# Patient Record
Sex: Female | Born: 1965 | Race: White | Hispanic: No | Marital: Married | Smoking: Never smoker
Health system: Southern US, Community
[De-identification: ages and names within clinical notes are randomized; demographics above are authoritative.]

## PROBLEM LIST (undated history)

## (undated) DIAGNOSIS — E669 Obesity, unspecified: Secondary | ICD-10-CM

## (undated) DIAGNOSIS — G43909 Migraine, unspecified, not intractable, without status migrainosus: Secondary | ICD-10-CM

## (undated) DIAGNOSIS — R32 Unspecified urinary incontinence: Secondary | ICD-10-CM

## (undated) DIAGNOSIS — M1990 Unspecified osteoarthritis, unspecified site: Secondary | ICD-10-CM

## (undated) DIAGNOSIS — K5792 Diverticulitis of intestine, part unspecified, without perforation or abscess without bleeding: Secondary | ICD-10-CM

## (undated) HISTORY — DX: Unspecified osteoarthritis, unspecified site: M19.90

## (undated) HISTORY — DX: Diverticulitis of intestine, part unspecified, without perforation or abscess without bleeding: K57.92

## (undated) HISTORY — DX: Obesity, unspecified: E66.9

## (undated) HISTORY — DX: Unspecified urinary incontinence: R32

## (undated) HISTORY — DX: Migraine, unspecified, not intractable, without status migrainosus: G43.909

---

## 1993-12-07 HISTORY — PX: LAPAROSCOPIC SALPINGO OOPHERECTOMY: SHX5927

## 2006-12-07 HISTORY — PX: LAPAROSCOPIC SALPINGO OOPHERECTOMY: SHX5927

## 2006-12-07 HISTORY — PX: ABDOMINAL HYSTERECTOMY: SHX81

## 2008-12-07 HISTORY — PX: OTHER SURGICAL HISTORY: SHX169

## 2009-01-28 ENCOUNTER — Ambulatory Visit: Payer: Self-pay | Admitting: Internal Medicine

## 2009-09-27 ENCOUNTER — Ambulatory Visit: Payer: Self-pay | Admitting: Unknown Physician Specialty

## 2009-10-11 ENCOUNTER — Ambulatory Visit: Payer: Self-pay | Admitting: Unknown Physician Specialty

## 2009-10-18 ENCOUNTER — Ambulatory Visit: Payer: Self-pay | Admitting: Unknown Physician Specialty

## 2010-04-21 ENCOUNTER — Ambulatory Visit: Payer: Self-pay | Admitting: Obstetrics and Gynecology

## 2012-08-30 ENCOUNTER — Ambulatory Visit: Payer: Self-pay | Admitting: Internal Medicine

## 2013-05-20 IMAGING — MG MM DIGITAL SCREENING BILAT W/ CAD
1 series · 4 of 4 positions shown · non-contrast
Comparison: none

REASON FOR EXAM: SCR MAMMO NO ORDER
COMMENTS:

PROCEDURE:     MMM - MMM DGT SCR NO ORDER W/CAD  - August 30, 2012  [DATE]
RESULT:     COMPARISON:  04/21/2010
TECHNIQUE: Digital screening mammograms were obtained. FDA approved
computer-aided detection (CAD) for mammography was utilized for this study.

[Series 8859: R CC · right · 4 of 4 slices shown]
[im 1/4]
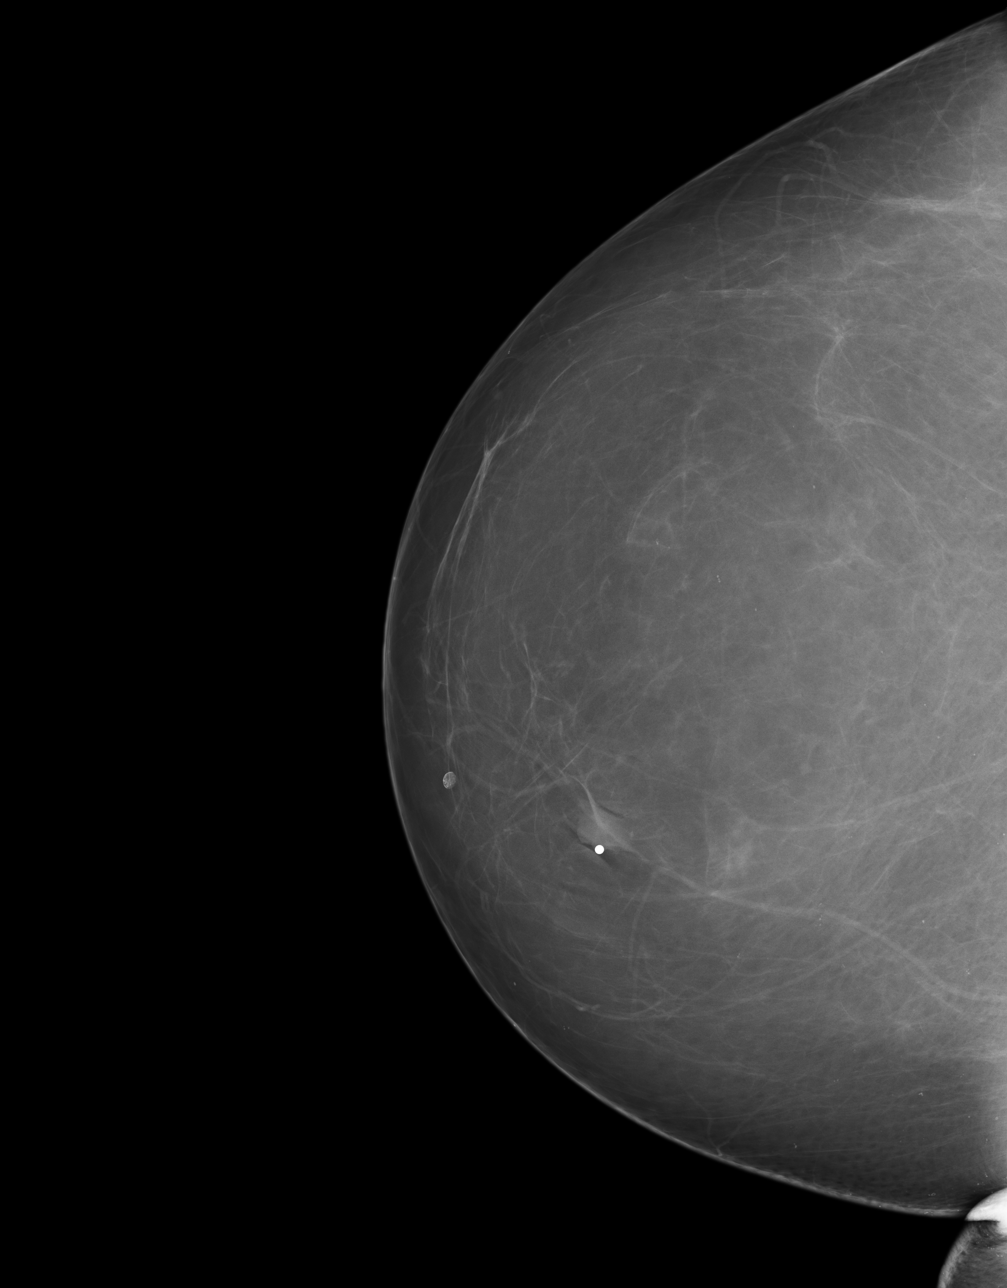
[im 2/4]
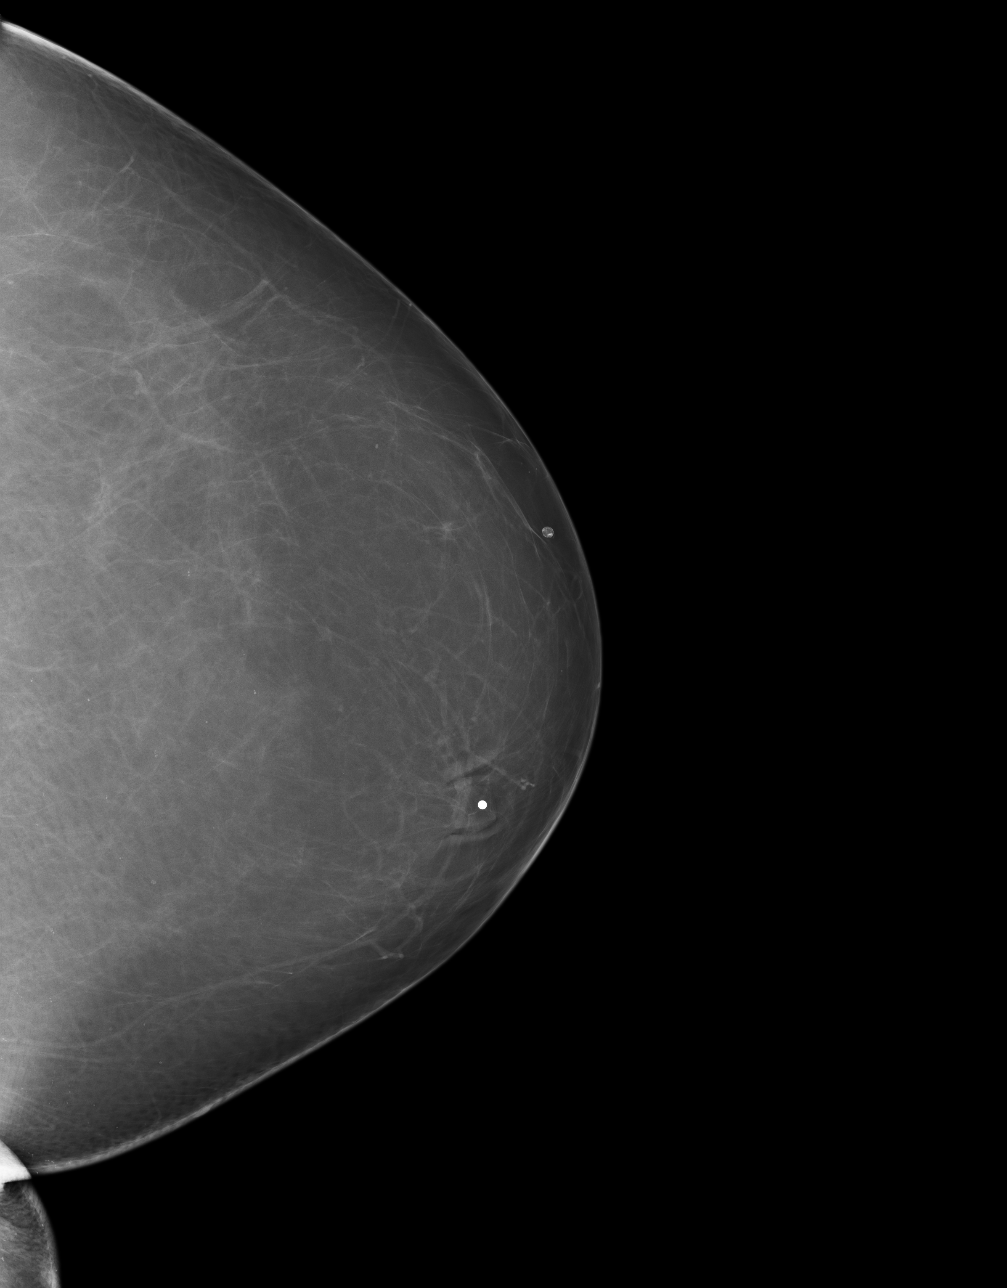
[im 3/4]
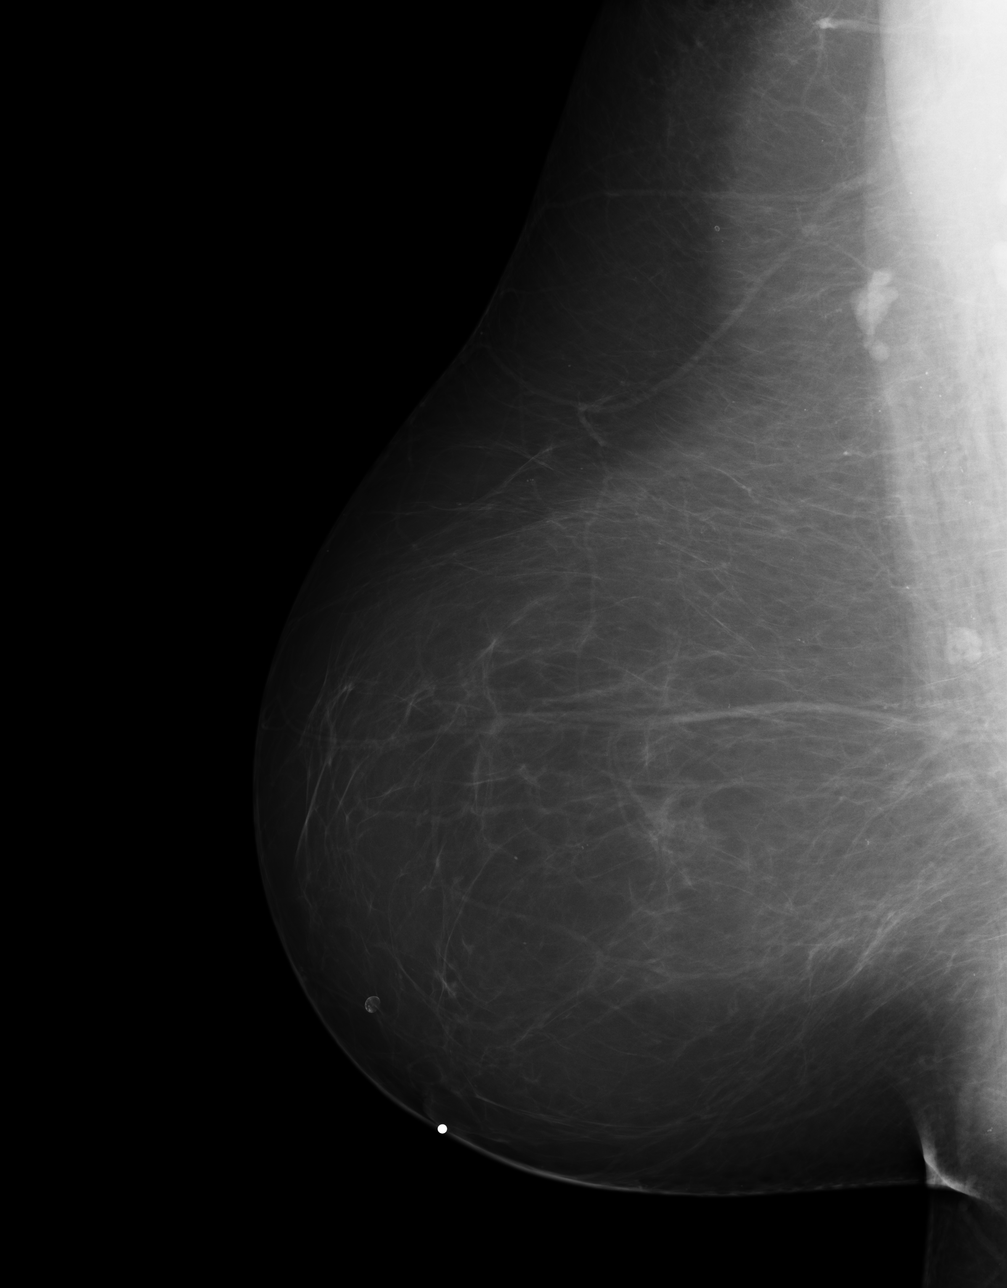
[im 4/4]
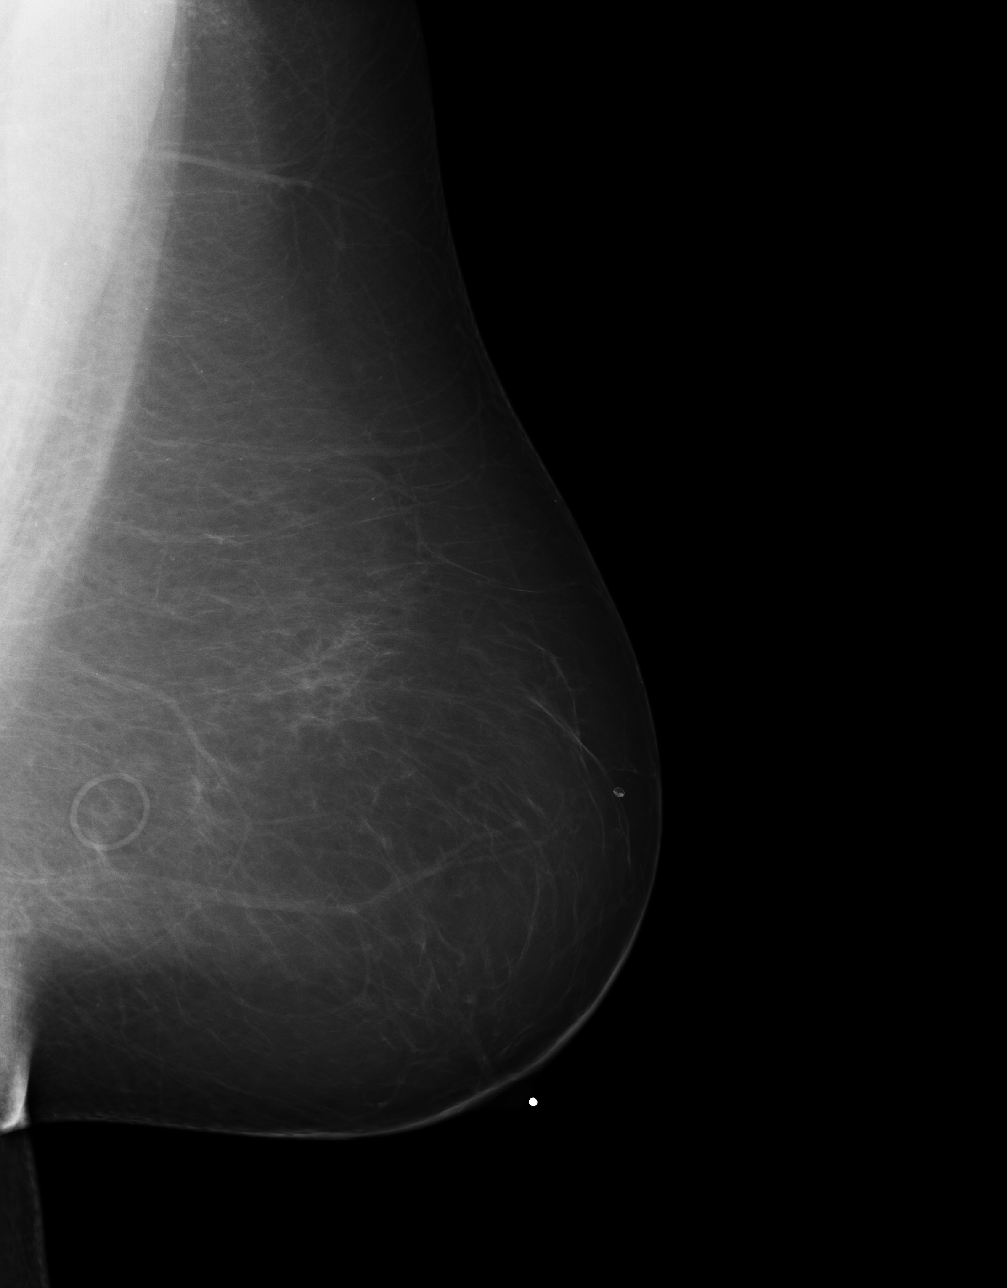

[4 of 4 positions shown; findings below may reference images not displayed]

FINDING: Bilateral breasts demonstrate a scattered fibroglandular density. There is
no dominant mass, architectural distortion or clusters of suspicious
microcalcifications.
IMPRESSION: 1.     Stable bilateral mammogram.
2.     Annual mammographic follow up recommended.
3.     BI-RADS:  Category 2- Benign.

A negative mammogram report does not preclude biopsy or other evaluation of
a clinically palpable or otherwise suspicious mass or lesion. Breast cancer
may not be detected by mammography in up to 10% of cases.

[REDACTED]

## 2013-07-06 ENCOUNTER — Encounter: Payer: Self-pay | Admitting: Adult Health

## 2013-07-06 ENCOUNTER — Ambulatory Visit (INDEPENDENT_AMBULATORY_CARE_PROVIDER_SITE_OTHER): Payer: BC Managed Care – PPO | Admitting: Adult Health

## 2013-07-06 VITALS — BP 138/76 | HR 98 | Temp 98.8°F | Resp 12 | Ht 68.0 in | Wt 245.5 lb

## 2013-07-06 DIAGNOSIS — B372 Candidiasis of skin and nail: Secondary | ICD-10-CM

## 2013-07-06 DIAGNOSIS — Z0000 Encounter for general adult medical examination without abnormal findings: Secondary | ICD-10-CM

## 2013-07-06 DIAGNOSIS — Z713 Dietary counseling and surveillance: Secondary | ICD-10-CM

## 2013-07-06 MED ORDER — PHENTERMINE HCL 37.5 MG PO CAPS: 37.5000 mg | ORAL_CAPSULE | ORAL | Status: DC

## 2013-07-06 MED ORDER — NYSTATIN 100000 UNIT/GM EX CREA: TOPICAL_CREAM | Freq: Two times a day (BID) | CUTANEOUS | Status: AC

## 2013-07-06 NOTE — Assessment & Plan Note (Addendum)
Patient develops yeast in skin folds on lower abdomen. Apply nystatin cream bid to affected area. It is hard for her to keep this area dry. She sweats and area stays moist creating grounds for the growth of yeast.

## 2013-07-06 NOTE — Progress Notes (Signed)
Subjective:    Patient ID: Dana Howell, female    DOB: 21-May-1966, 47 y.o.   MRN: [id number]  HPI  Dana Howell is a pleasant 47 y/o female who presents to clinic to establish care. She was previously followed by Dr. Audelia Acton at [facility] in [place]. She is followed by Bosie Clos, NP at [facility]) for anxiety and depression. Jahnaya is overall feeling well. She is concerned about her weight and would like to work on this. She is limited in her exercising because of problems with her knees. She is planning on starting to ride an exercise bike.   Past Medical History  Diagnosis Date  . Arthritis   . Migraine   . Diverticulitis   . Urinary incontinence     Urgency    Past Surgical History  Procedure Laterality Date  . Abdominal hysterectomy  2008    [facility]  . Laparoscopic salpingo oopherectomy  1995    right  . Laparoscopic salpingo oopherectomy  2008    left  . Right knee surgery  2010    right - Dr. Gavin Potters    Family History  Problem Relation Age of Onset  . Arthritis Mother   . Hypertension Mother   . Diabetes Mother   . Arthritis Father   . Hyperlipidemia Father   . Hypertension Father   . ADD / ADHD Brother   . Arthritis Maternal Grandmother   . Cancer Maternal Grandfather     lung ca    History   Social History  . Marital Status: Married    Spouse Name: N/A    Number of Children: 1  . Years of Education: 13   Occupational History  . [employer]     [employer]   Social History Main Topics  . Smoking status: Never Smoker   . Smokeless tobacco: Never Used  . Alcohol Use: Yes     Comment: 1 drink every 6-7 months  . Drug Use: No  . Sexually Active: Yes -- Female partner(s)    Birth Control/ Protection: Surgical   Other Topics Concern  . Not on file   Social History Narrative   Dana Howell was born and grew up in [place], [place]. She graduated from [employer]. She lives at home with her husband of 78 years  and their 57 y/o son. 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 Previously she worked at [employer] for 11 years. Dana Howell enjoys working with [employer], cross-stitching, crafting, camping and crocheting.      Review of Systems  Constitutional: Positive for unexpected weight change. Negative for fever, chills and fatigue.  Eyes:       Wears contacts. Followed by Dr. Clydene Pugh in [place]  Respiratory: Positive for wheezing. Negative for cough and shortness of breath.        Faint wheezing noticed at night.  Cardiovascular: Negative.   Gastrointestinal: Negative for nausea, vomiting, abdominal pain, diarrhea, constipation and blood in stool.  Endocrine: Negative.   Genitourinary: Negative.   Musculoskeletal:       Bilateral knee problems. Low back discomfort.  Skin:       Multiple freckles throughout  Allergic/Immunologic: Positive for environmental allergies.       Seasonal allergies  Neurological: Positive for headaches.       Mostly sinus HA. Tylenol or Aleve helps  Hematological: Negative.   Psychiatric/Behavioral: Negative for suicidal ideas, behavioral problems, confusion, self-injury and  agitation. The patient is nervous/anxious.        Hx of depression. Previously on wellbutrin and zoloft. Currently taking natural supplements.     BP 138/76  Pulse 98  Temp(Src) 98.8 F (37.1 C) (Oral)  Resp 12  Ht 5\' 8"  (1.727 m)  Wt 245 lb 8 oz (111.358 kg)  BMI 37.34 kg/m2  SpO2 97%    Objective:   Physical Exam  Constitutional: She is oriented to person, place, and time. No distress.  Overweight, pleasant female in NAD.  HENT:  Head: Normocephalic and atraumatic.  Right Ear: External ear normal.  Left Ear: External ear normal.  Eyes: Conjunctivae and EOM are normal. Pupils are equal, round, and reactive to light.  Neck: Normal range of motion. Neck supple. No tracheal deviation present.  Cardiovascular: Normal rate,  regular rhythm, normal heart sounds and intact distal pulses.  Exam reveals no gallop.   No murmur heard. Pulmonary/Chest: Effort normal and breath sounds normal. No respiratory distress. She has no wheezes. She has no rales.  Abdominal: Soft. Bowel sounds are normal.  Musculoskeletal: Normal range of motion. She exhibits no edema and no tenderness.  Lymphadenopathy:    She has no cervical adenopathy.  Neurological: She is alert and oriented to person, place, and time. She has normal reflexes.  Skin: Skin is warm and dry.  Psychiatric: She has a normal mood and affect. Her behavior is normal. Judgment and thought content normal.      Assessment & Plan:

## 2013-07-06 NOTE — Assessment & Plan Note (Signed)
Normal physical exam except for problems with excess weight. She will begin to work on losing weight. Encourage slow, steady loss. Consistency is key. Labs: cbc w/diff, bmet, hepatic panel, tsh, lipids.

## 2013-07-06 NOTE — Patient Instructions (Addendum)
   Thank you for choosing [facility] for your health care needs.  Please have your labs drawn at [facility] at your earliest convenience. You cannot have anything to eat or drink for 8 hours prior to lab work. You can have water or plain black coffee without cream or sugar.  The results will be available through MyChart for your convenience. Please remember to activate this. The activation code is located at the end of this form.

## 2013-07-06 NOTE — Assessment & Plan Note (Signed)
Provided patient with copy of Dr. Melina Schools diet. Encouraged exercises with bike that would not be hard on her knees. Once she is able to lose some weight she will be able to tolerate other types of exercises. Encouraged her to keep a food diary. This will keep her aware of her consumption. Provided her with prescription for phentermine.

## 2013-07-07 ENCOUNTER — Encounter: Payer: Self-pay | Admitting: Adult Health

## 2013-07-12 ENCOUNTER — Encounter: Payer: Self-pay | Admitting: *Deleted

## 2013-07-21 ENCOUNTER — Encounter: Payer: Self-pay | Admitting: Adult Health

## 2013-07-26 ENCOUNTER — Ambulatory Visit (INDEPENDENT_AMBULATORY_CARE_PROVIDER_SITE_OTHER): Payer: BC Managed Care – PPO | Admitting: Adult Health

## 2013-07-26 VITALS — BP 143/85 | HR 100 | Temp 98.4°F | Resp 16

## 2013-07-26 DIAGNOSIS — J329 Chronic sinusitis, unspecified: Secondary | ICD-10-CM | POA: Insufficient documentation

## 2013-07-26 MED ORDER — AZITHROMYCIN 250 MG PO TABS: ORAL_TABLET | ORAL | Status: DC

## 2013-07-26 MED ORDER — BENZONATATE 100 MG PO CAPS: 100.0000 mg | ORAL_CAPSULE | Freq: Three times a day (TID) | ORAL | Status: DC | PRN

## 2013-07-26 NOTE — Assessment & Plan Note (Signed)
Start azithromycin. Patient has taken Augmentin in the past and believes that it gave her diarrhea. Tessalon for cough. RTC if symptoms are not improved in 3-4 days.

## 2013-07-26 NOTE — Progress Notes (Signed)
  Subjective:    Patient ID: Dana Howell, female    DOB: 13-May-1966, 47 y.o.   MRN: [id number]  HPI  Patient presents with sinus congestion, cough, chills, low grade temp. Symptoms began on Sunday. Does not recall sick contacts.   Current Outpatient Prescriptions on File Prior to Visit  Medication Sig Dispense Refill  . clonazePAM (KLONOPIN) 1 MG tablet Take 2 mg by mouth at bedtime.       . NON FORMULARY Take 1 each by mouth daily. Plexus Slim Pink Drink      . NON FORMULARY Take 1 each by mouth daily. Plexus Slim X Factor      . NON FORMULARY Take 1 each by mouth daily. Plexus Slim BioCleanse      . NON FORMULARY Take 1 each by mouth daily. Plexus Slim Boost      . nystatin cream (MYCOSTATIN) Apply topically 2 (two) times daily. Apply to affected area twice daily  30 g  0  . phentermine 37.5 MG capsule Take 1 capsule (37.5 mg total) by mouth every morning.  30 capsule  0   No current facility-administered medications on file prior to visit.    Review of Systems  Constitutional: Positive for fever and chills.  HENT: Positive for congestion, postnasal drip and sinus pressure. Negative for sore throat.   Respiratory: Positive for cough. Negative for shortness of breath and wheezing.   Cardiovascular: Negative.   Gastrointestinal: Negative.   Neurological: Negative.   Psychiatric/Behavioral: Negative.        Objective:   Physical Exam  Constitutional: She is oriented to person, place, and time.  Overweight, pleasant female who appears to not be feeling well  HENT:  Head: Normocephalic and atraumatic.  Oral pharyngeal erythema and drainage.  Cardiovascular: Exam reveals no gallop.   No murmur heard. Tachycardia, regular rhythm  Pulmonary/Chest: Effort normal and breath sounds normal. No respiratory distress. She has no wheezes. She has no rales.  Lymphadenopathy:    She has no cervical adenopathy.  Neurological: She is alert and oriented to person, place, and time.  Skin:  Skin is warm and dry.  Psychiatric: She has a normal mood and affect. Her behavior is normal. Judgment and thought content normal.          Assessment & Plan:

## 2013-08-10 ENCOUNTER — Other Ambulatory Visit: Payer: Self-pay | Admitting: Adult Health

## 2013-08-10 NOTE — Telephone Encounter (Signed)
Ok to refill? Last OV 8.20.14 Last filled 7.31.14

## 2013-08-10 NOTE — Telephone Encounter (Signed)
Refilled Rx. 

## 2013-08-10 NOTE — Telephone Encounter (Signed)
Ok to refill 

## 2013-08-10 NOTE — Telephone Encounter (Signed)
Rx faxed to [facility] 

## 2013-09-01 ENCOUNTER — Ambulatory Visit: Payer: Self-pay | Admitting: Nurse Practitioner

## 2013-09-07 ENCOUNTER — Other Ambulatory Visit: Payer: Self-pay | Admitting: Adult Health

## 2013-09-07 NOTE — Telephone Encounter (Signed)
yes

## 2013-09-07 NOTE — Telephone Encounter (Signed)
Rx faxed

## 2013-09-07 NOTE — Telephone Encounter (Signed)
Refill

## 2013-09-08 ENCOUNTER — Encounter: Payer: Self-pay | Admitting: *Deleted

## 2013-09-28 ENCOUNTER — Telehealth: Payer: Self-pay | Admitting: Adult Health

## 2013-09-28 NOTE — Telephone Encounter (Signed)
error 

## 2013-10-12 ENCOUNTER — Other Ambulatory Visit: Payer: Self-pay

## 2013-10-27 ENCOUNTER — Encounter: Payer: Self-pay | Admitting: Adult Health

## 2013-10-27 ENCOUNTER — Ambulatory Visit (INDEPENDENT_AMBULATORY_CARE_PROVIDER_SITE_OTHER): Payer: BC Managed Care – PPO | Admitting: Adult Health

## 2013-10-27 VITALS — BP 126/80 | HR 118 | Temp 98.4°F | Resp 16 | Ht 70.0 in | Wt 311.0 lb

## 2013-10-27 DIAGNOSIS — R059 Cough, unspecified: Secondary | ICD-10-CM

## 2013-10-27 DIAGNOSIS — R05 Cough: Secondary | ICD-10-CM | POA: Insufficient documentation

## 2013-10-27 MED ORDER — AZITHROMYCIN 250 MG PO TABS: ORAL_TABLET | ORAL | Status: DC

## 2013-10-27 MED ORDER — GUAIFENESIN-CODEINE 100-10 MG/5ML PO SOLN: 5.0000 mL | Freq: Three times a day (TID) | ORAL | Status: DC | PRN

## 2013-10-27 NOTE — Progress Notes (Signed)
Pre visit review using our clinic review tool, if applicable. No additional management support is needed unless otherwise documented below in the visit note. 

## 2013-10-27 NOTE — Patient Instructions (Signed)
  Start Azithromycin as directed.  Robitussin AC for cough. This has codeine so it will make you sleepy.

## 2013-10-27 NOTE — Progress Notes (Signed)
  Subjective:    Patient ID: Kharisma Glasner, female    DOB: September 21, 1966, 47 y.o.   MRN: [id number]  HPI  Pt is a pleasant 47 yo female, presents with congestion and productive cough with yellow sputum since last Thursday.  Pt has been taking tessalon perles, states it was helping with cough at first, but doesn't seem to be suppressing cough as well now.   Pt denies body aches, fever, or chills.   Current Outpatient Prescriptions on File Prior to Visit  Medication Sig Dispense Refill  . benzonatate (TESSALON) 100 MG capsule Take 1 capsule (100 mg total) by mouth 3 (three) times daily as needed for cough.  20 capsule  0  . clonazePAM (KLONOPIN) 1 MG tablet Take 2 mg by mouth at bedtime.       . NON FORMULARY Take 1 each by mouth daily. Plexus Slim Pink Drink      . NON FORMULARY Take 1 each by mouth daily. Plexus Slim X Factor      . NON FORMULARY Take 1 each by mouth daily. Plexus Slim BioCleanse      . NON FORMULARY Take 1 each by mouth daily. Plexus Slim Boost      . nystatin cream (MYCOSTATIN) Apply topically 2 (two) times daily. Apply to affected area twice daily  30 g  0  . phentermine (ADIPEX-P) 37.5 MG tablet TAKE 1 TABLET EVERY MORNING  30 tablet  0  . phentermine 37.5 MG capsule Take 1 capsule (37.5 mg total) by mouth every morning.  30 capsule  0   No current facility-administered medications on file prior to visit.     Review of Systems  HENT: Positive for congestion, postnasal drip, sinus pressure and voice change.   Respiratory: Positive for cough and wheezing. Negative for shortness of breath.        Objective:   Physical Exam  Constitutional: She is oriented to person, place, and time. No distress.  Pleasant, overweight female  HENT:  Mouth/Throat: Posterior oropharyngeal erythema present.  Cardiovascular: Normal rate and regular rhythm.   Pulmonary/Chest: Effort normal and breath sounds normal. No respiratory distress. She has no wheezes. She has no rales.   Neurological: She is alert and oriented to person, place, and time.  Psychiatric: She has a normal mood and affect. Her behavior is normal. Judgment and thought content normal.    BP 126/80  Pulse 118  Temp(Src) 98.4 F (36.9 C) (Oral)  Resp 16  Ht 5\' 10"  (1.778 m)  Wt 311 lb (141.069 kg)  BMI 44.62 kg/m2  SpO2 95%       Assessment & Plan:

## 2013-10-27 NOTE — Assessment & Plan Note (Signed)
Productive yellow sputum. Start azithromycin, Robitussin-AC for cough. Continue supportive care with Tylenol for general aches and pains and fever. RTC if no improvement in 4-5 days

## 2013-10-30 ENCOUNTER — Other Ambulatory Visit: Payer: Self-pay | Admitting: Adult Health

## 2013-10-30 ENCOUNTER — Encounter: Payer: Self-pay | Admitting: Adult Health

## 2013-10-30 ENCOUNTER — Telehealth: Payer: Self-pay | Admitting: Adult Health

## 2013-10-30 MED ORDER — GUAIFENESIN-CODEINE 100-10 MG/5ML PO SOLN: 5.0000 mL | Freq: Three times a day (TID) | ORAL | Status: DC | PRN

## 2013-10-30 MED ORDER — ALBUTEROL SULFATE HFA 108 (90 BASE) MCG/ACT IN AERS: 2.0000 | INHALATION_SPRAY | Freq: Four times a day (QID) | RESPIRATORY_TRACT | Status: DC | PRN

## 2013-10-30 NOTE — Telephone Encounter (Signed)
Pt states that Raquel says she was going to call in her Albuterol inhaler and she wanted to [facility] [place] [facility] [address]

## 2013-10-31 ENCOUNTER — Encounter: Payer: Self-pay | Admitting: *Deleted

## 2013-11-14 ENCOUNTER — Encounter: Payer: Self-pay | Admitting: Adult Health

## 2013-12-17 ENCOUNTER — Encounter: Payer: Self-pay | Admitting: Adult Health

## 2013-12-18 ENCOUNTER — Ambulatory Visit (INDEPENDENT_AMBULATORY_CARE_PROVIDER_SITE_OTHER): Payer: BC Managed Care – PPO | Admitting: Adult Health

## 2013-12-18 ENCOUNTER — Encounter: Payer: Self-pay | Admitting: Adult Health

## 2013-12-18 VITALS — BP 122/82 | HR 92 | Temp 98.1°F | Resp 12 | Wt 322.5 lb

## 2013-12-18 DIAGNOSIS — J329 Chronic sinusitis, unspecified: Secondary | ICD-10-CM

## 2013-12-18 MED ORDER — AMOXICILLIN-POT CLAVULANATE 875-125 MG PO TABS: 1.0000 | ORAL_TABLET | Freq: Two times a day (BID) | ORAL | Status: DC

## 2013-12-18 MED ORDER — FLUTICASONE PROPIONATE 50 MCG/ACT NA SUSP: 2.0000 | Freq: Every day | NASAL | Status: DC

## 2013-12-18 NOTE — Assessment & Plan Note (Signed)
Symptoms ongoing greater than 14 days. Start Augmentin x 10 days. Add flonase daily. RTC if no improvement within 4-5 days.

## 2013-12-18 NOTE — Progress Notes (Signed)
   Subjective:    Patient ID: Dana Howell, female    DOB: 06/16/1966, 48 y.o.   MRN: [id number]  HPI  Patient presents with sinus infection, sinus drainage, pressure, HA, fever, post nasal drip, green secretions that have been ongoing for greater than 14 days. She takes Azelastin nasal spray. Has tried decongestants over the counter but not improved.  Current Outpatient Prescriptions on File Prior to Visit  Medication Sig Dispense Refill  . albuterol (PROVENTIL HFA;VENTOLIN HFA) 108 (90 BASE) MCG/ACT inhaler Inhale 2 puffs into the lungs every 6 (six) hours as needed for wheezing or shortness of breath.  1 Inhaler  2  . clonazePAM (KLONOPIN) 1 MG tablet Take by mouth at bedtime. 1-1.5 tab      . guaiFENesin-codeine 100-10 MG/5ML syrup Take 5 mLs by mouth 3 (three) times daily as needed for cough.  120 mL  0  . NON FORMULARY Take 1 each by mouth daily. Plexus Slim Pink Drink      . nystatin cream (MYCOSTATIN) Apply topically 2 (two) times daily. Apply to affected area twice daily  30 g  0   No current facility-administered medications on file prior to visit.     Review of Systems Positive for sinus pressure, drainage that is green, post nasal drip, sinus congestion, fever (none today) Negative for chest symptoms, no wheezing, sob.    Objective:   Physical Exam  Constitutional:  Acutely ill  HENT:  Head: Normocephalic and atraumatic.  Mouth/Throat: No oropharyngeal exudate.  Pharyngeal secretions. Unable to breathe out of her nose 2/2 severe congestion  Cardiovascular: Normal rate and regular rhythm.   Pulmonary/Chest: Effort normal. No respiratory distress.  Lymphadenopathy:    She has no cervical adenopathy.  Neurological: She is alert.  Psychiatric: She has a normal mood and affect. Her behavior is normal. Judgment and thought content normal.          Assessment & Plan:

## 2013-12-18 NOTE — Progress Notes (Signed)
Pre visit review using our clinic review tool, if applicable. No additional management support is needed unless otherwise documented below in the visit note. 

## 2013-12-18 NOTE — Telephone Encounter (Signed)
Disregard my last sentence, I oops-ed! I see it was sent through your account. Thanks!

## 2013-12-24 ENCOUNTER — Encounter: Payer: Self-pay | Admitting: Adult Health

## 2013-12-25 ENCOUNTER — Other Ambulatory Visit: Payer: Self-pay | Admitting: Adult Health

## 2013-12-25 MED ORDER — AZITHROMYCIN 250 MG PO TABS: ORAL_TABLET | ORAL | Status: DC

## 2014-02-22 ENCOUNTER — Telehealth: Payer: Self-pay | Admitting: *Deleted

## 2014-02-22 ENCOUNTER — Ambulatory Visit (INDEPENDENT_AMBULATORY_CARE_PROVIDER_SITE_OTHER): Payer: BC Managed Care – PPO | Admitting: Adult Health

## 2014-02-22 ENCOUNTER — Encounter: Payer: Self-pay | Admitting: Adult Health

## 2014-02-22 VITALS — BP 123/83 | HR 89 | Temp 98.2°F | Resp 14 | Wt 324.0 lb

## 2014-02-22 DIAGNOSIS — R600 Localized edema: Secondary | ICD-10-CM

## 2014-02-22 DIAGNOSIS — Z713 Dietary counseling and surveillance: Secondary | ICD-10-CM

## 2014-02-22 DIAGNOSIS — R609 Edema, unspecified: Secondary | ICD-10-CM

## 2014-02-22 MED ORDER — HYDROCHLOROTHIAZIDE 25 MG PO TABS: ORAL_TABLET | ORAL | Status: AC

## 2014-02-22 MED ORDER — LORCASERIN HCL 10 MG PO TABS: 1.0000 | ORAL_TABLET | Freq: Two times a day (BID) | ORAL | Status: AC

## 2014-02-22 NOTE — Telephone Encounter (Signed)
PA received, given to Raquel for completion.

## 2014-02-22 NOTE — Telephone Encounter (Signed)
PA received from pharmacy

## 2014-02-22 NOTE — Progress Notes (Signed)
Pre visit review using our clinic review tool, if applicable. No additional management support is needed unless otherwise documented below in the visit note. 

## 2014-02-22 NOTE — Telephone Encounter (Signed)
Called [facility] to receive PA form.

## 2014-02-22 NOTE — Patient Instructions (Signed)

## 2014-02-22 NOTE — Progress Notes (Signed)
Patient ID: Dana Howell, female   DOB: Jul 20, 1966, 48 y.o.   MRN: [id number]   Subjective:    Patient ID: Sherrilyn Rist, female    DOB: 02-04-1966, 48 y.o.   MRN: [id number]  HPI  Pt is a pleasant 48 year old female who presents to clinic  To discuss options for weight loss. She has been on phentermine in the past. Did not feel that the medication was effective . She reports that the medication did not suppress her appetite. She has gained 2 pounds since January. Blood pressure is well controlled.    Past Medical History  Diagnosis Date  . Arthritis   . Migraine   . Diverticulitis   . Urinary incontinence     Urgency  . Obesity      Past Surgical History  Procedure Laterality Date  . Abdominal hysterectomy  2008    [facility]  . Laparoscopic salpingo oopherectomy  1995    right  . Laparoscopic salpingo oopherectomy  2008    left  . Right knee surgery  2010    right - Dr. Gavin Potters     Family History  Problem Relation Age of Onset  . Arthritis Mother   . Hypertension Mother   . Diabetes Mother   . Arthritis Father   . Hyperlipidemia Father   . Hypertension Father   . ADD / ADHD Brother   . Arthritis Maternal Grandmother   . Cancer Maternal Grandfather     lung ca     History   Social History  . Marital Status: Married    Spouse Name: N/A    Number of Children: 1  . Years of Education: 13   Occupational History  . [employer]     [employer]   Social History Main Topics  . Smoking status: Never Smoker   . Smokeless tobacco: Never Used  . Alcohol Use: Yes     Comment: 1 drink every 6-7 months  . Drug Use: No  . Sexual Activity: Yes    Partners: Male    Birth Control/ Protection: Surgical   Other Topics Concern  . Not on file   Social History Narrative   Skii was born and grew up in [place], [place]. She graduated from [employer]. She lives at home with her husband of 84 years and their 32 y/o son. 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 Lorilyn enjoys working with [employer], cross-stitching, crafting, camping and crocheting.      Current Outpatient Prescriptions on File Prior to Visit  Medication Sig Dispense Refill  . clonazePAM (KLONOPIN) 1 MG tablet Take by mouth at bedtime. 1-1.5 tab      . fluticasone (FLONASE) 50 MCG/ACT nasal spray Place 2 sprays into both nostrils daily.  16 g  6  . nystatin cream (MYCOSTATIN) Apply topically 2 (two) times daily. Apply to affected area twice daily  30 g  0   No current facility-administered medications on file prior to visit.     Review of Systems  Constitutional: Negative.   HENT: Negative.   Eyes: Negative.   Respiratory: Negative.   Cardiovascular: Positive for leg swelling (noticed at the end of the day).  Gastrointestinal: Negative.   Endocrine: Negative.   Genitourinary: Negative.   Musculoskeletal: Positive for arthralgias (knee pain bilaterally 2/2 weight).  Skin: Negative.   Allergic/Immunologic: Negative.   Neurological: Negative.  Hematological: Negative.   Psychiatric/Behavioral: Negative.        Objective:  BP 123/83  Pulse 89  Temp(Src) 98.2 F (36.8 C) (Oral)  Resp 14  Wt 324 lb (146.965 kg)  SpO2 96%   Physical Exam  Constitutional: She is oriented to person, place, and time.  Pleasant female in NAD  HENT:  Head: Normocephalic and atraumatic.  Eyes: Conjunctivae and EOM are normal.  Cardiovascular: Normal rate and regular rhythm.   Pulmonary/Chest: Effort normal. No respiratory distress.  Musculoskeletal: Normal range of motion. She exhibits no edema and no tenderness.  Neurological: She is alert and oriented to person, place, and time.  Psychiatric: She has a normal mood and affect. Her behavior is normal. Judgment and thought content normal.      Assessment & Plan:   1. Weight loss counseling, encounter  for Provided with Dr. Melina Schoolsullo's diet plan. We discussed techniques to help her lose weight such as eating slowly allowing the brain time to send signals that she is full. Once full, step away. Portion control. Increasing activity such as brisk waking. Getting the family involved. She needs to motivate herself to lose weight. We will try Belviq 10 mg bid. Discussed if not down 5% of weight within 12 weeks will discontinue. Pt agreed. Follow up in 3 months  2. Edema extremities Start HCTZ 25 mg tab 1/2 tablet daily. Continue to follow.

## 2014-02-23 DIAGNOSIS — Z0279 Encounter for issue of other medical certificate: Secondary | ICD-10-CM

## 2014-02-23 NOTE — Telephone Encounter (Signed)
PA form completed and [facility]

## 2014-03-11 ENCOUNTER — Encounter: Payer: Self-pay | Admitting: Adult Health

## 2014-03-13 ENCOUNTER — Other Ambulatory Visit: Payer: Self-pay | Admitting: Adult Health

## 2014-03-13 MED ORDER — AZELASTINE HCL 0.1 % NA SOLN: 1.0000 | Freq: Two times a day (BID) | NASAL | Status: AC

## 2014-04-20 ENCOUNTER — Encounter: Payer: Self-pay | Admitting: Adult Health

## 2014-05-28 ENCOUNTER — Encounter: Payer: Self-pay | Admitting: Adult Health

## 2014-05-28 ENCOUNTER — Telehealth: Payer: Self-pay | Admitting: *Deleted

## 2014-05-28 NOTE — Telephone Encounter (Signed)
[facility], needing PA on Belviq. PA started online, pending authorization.

## 2014-06-06 NOTE — Telephone Encounter (Signed)
This encounter was created in error - please disregard.

## 2014-07-10 ENCOUNTER — Telehealth: Payer: Self-pay

## 2014-07-10 NOTE — Telephone Encounter (Signed)
PA for Belviq Denied. Patient has not lost greater than 5% of body weight. Patient notified.

## 2014-07-17 ENCOUNTER — Telehealth: Payer: Self-pay | Admitting: Adult Health

## 2014-07-17 NOTE — Telephone Encounter (Signed)
  Message regarding her mother in law passing.

## 2014-10-19 ENCOUNTER — Ambulatory Visit: Payer: Self-pay | Admitting: Family Medicine

## 2014-12-25 ENCOUNTER — Other Ambulatory Visit: Payer: Self-pay | Admitting: *Deleted

## 2014-12-25 MED ORDER — FLUTICASONE PROPIONATE 50 MCG/ACT NA SUSP: 2.0000 | Freq: Every day | NASAL | Status: AC

## 2015-05-12 ENCOUNTER — Other Ambulatory Visit: Payer: Self-pay | Admitting: Nurse Practitioner
